# Patient Record
Sex: Female | Born: 1965 | Race: Black or African American | Hispanic: No | Marital: Married | Smoking: Never smoker
Health system: Southern US, Community
[De-identification: ages and names within clinical notes are randomized; demographics above are authoritative.]

## PROBLEM LIST (undated history)

## (undated) DIAGNOSIS — D649 Anemia, unspecified: Secondary | ICD-10-CM

## (undated) DIAGNOSIS — R519 Headache, unspecified: Secondary | ICD-10-CM

## (undated) DIAGNOSIS — R51 Headache: Secondary | ICD-10-CM

## (undated) DIAGNOSIS — I10 Essential (primary) hypertension: Secondary | ICD-10-CM

## (undated) HISTORY — PX: ROTATOR CUFF REPAIR: SHX139

## (undated) HISTORY — PX: HERNIA REPAIR: SHX51

---

## 1999-07-07 ENCOUNTER — Other Ambulatory Visit: Admission: RE | Admit: 1999-07-07 | Discharge: 1999-07-07 | Payer: Self-pay | Admitting: Obstetrics & Gynecology

## 2002-10-11 ENCOUNTER — Other Ambulatory Visit: Admission: RE | Admit: 2002-10-11 | Discharge: 2002-10-11 | Payer: Self-pay | Admitting: Obstetrics & Gynecology

## 2002-10-17 ENCOUNTER — Encounter: Payer: Self-pay | Admitting: Obstetrics & Gynecology

## 2002-10-17 ENCOUNTER — Encounter: Admission: RE | Admit: 2002-10-17 | Discharge: 2002-10-17 | Payer: Self-pay | Admitting: Obstetrics & Gynecology

## 2004-02-13 ENCOUNTER — Other Ambulatory Visit: Admission: RE | Admit: 2004-02-13 | Discharge: 2004-02-13 | Payer: Self-pay | Admitting: Obstetrics & Gynecology

## 2009-07-04 ENCOUNTER — Encounter: Admission: RE | Admit: 2009-07-04 | Discharge: 2009-07-04 | Payer: Self-pay | Admitting: Obstetrics & Gynecology

## 2010-07-04 ENCOUNTER — Encounter: Admission: RE | Admit: 2010-07-04 | Discharge: 2010-07-04 | Payer: Self-pay | Admitting: Obstetrics & Gynecology

## 2011-06-17 ENCOUNTER — Other Ambulatory Visit: Payer: Self-pay | Admitting: Obstetrics & Gynecology

## 2011-06-17 DIAGNOSIS — Z1231 Encounter for screening mammogram for malignant neoplasm of breast: Secondary | ICD-10-CM

## 2011-07-07 ENCOUNTER — Ambulatory Visit
Admission: RE | Admit: 2011-07-07 | Discharge: 2011-07-07 | Disposition: A | Discharge status: Home / Self Care | Payer: Managed Care, Other (non HMO) | Source: Ambulatory Visit | Attending: Obstetrics & Gynecology | Admitting: Obstetrics & Gynecology

## 2011-07-07 DIAGNOSIS — Z1231 Encounter for screening mammogram for malignant neoplasm of breast: Secondary | ICD-10-CM

## 2012-06-09 ENCOUNTER — Other Ambulatory Visit: Payer: Self-pay | Admitting: Obstetrics & Gynecology

## 2012-06-09 DIAGNOSIS — Z1231 Encounter for screening mammogram for malignant neoplasm of breast: Secondary | ICD-10-CM

## 2012-07-07 ENCOUNTER — Ambulatory Visit
Admission: RE | Admit: 2012-07-07 | Discharge: 2012-07-07 | Disposition: A | Discharge status: Home / Self Care | Payer: Managed Care, Other (non HMO) | Source: Ambulatory Visit | Attending: Obstetrics & Gynecology | Admitting: Obstetrics & Gynecology

## 2012-07-07 DIAGNOSIS — Z1231 Encounter for screening mammogram for malignant neoplasm of breast: Secondary | ICD-10-CM

## 2013-07-03 ENCOUNTER — Other Ambulatory Visit: Payer: Self-pay

## 2013-07-03 DIAGNOSIS — Z1231 Encounter for screening mammogram for malignant neoplasm of breast: Secondary | ICD-10-CM

## 2013-07-31 ENCOUNTER — Ambulatory Visit: Payer: Managed Care, Other (non HMO)

## 2013-08-21 ENCOUNTER — Ambulatory Visit
Admission: RE | Admit: 2013-08-21 | Discharge: 2013-08-21 | Disposition: A | Discharge status: Home / Self Care | Payer: Managed Care, Other (non HMO) | Source: Ambulatory Visit

## 2013-08-21 DIAGNOSIS — Z1231 Encounter for screening mammogram for malignant neoplasm of breast: Secondary | ICD-10-CM

## 2017-09-14 HISTORY — PX: REDUCTION MAMMAPLASTY: SUR839

## 2018-05-24 ENCOUNTER — Other Ambulatory Visit: Payer: Self-pay | Admitting: Obstetrics & Gynecology

## 2018-05-24 DIAGNOSIS — Z1231 Encounter for screening mammogram for malignant neoplasm of breast: Secondary | ICD-10-CM

## 2018-06-17 ENCOUNTER — Ambulatory Visit: Payer: Managed Care, Other (non HMO)

## 2018-08-04 ENCOUNTER — Encounter (HOSPITAL_BASED_OUTPATIENT_CLINIC_OR_DEPARTMENT_OTHER): Payer: Self-pay | Admitting: *Deleted

## 2018-08-04 ENCOUNTER — Encounter: Payer: Self-pay | Admitting: Obstetrics & Gynecology

## 2018-08-05 ENCOUNTER — Other Ambulatory Visit: Payer: Self-pay

## 2018-08-05 ENCOUNTER — Encounter (HOSPITAL_BASED_OUTPATIENT_CLINIC_OR_DEPARTMENT_OTHER): Payer: Self-pay | Admitting: *Deleted

## 2018-08-05 ENCOUNTER — Ambulatory Visit: Payer: Self-pay | Admitting: Plastic Surgery

## 2018-08-08 ENCOUNTER — Encounter (HOSPITAL_BASED_OUTPATIENT_CLINIC_OR_DEPARTMENT_OTHER)
Admission: RE | Admit: 2018-08-08 | Discharge: 2018-08-08 | Disposition: A | Discharge status: Home / Self Care | Payer: Managed Care, Other (non HMO) | Source: Ambulatory Visit | Attending: Obstetrics and Gynecology | Admitting: Obstetrics and Gynecology

## 2018-08-08 DIAGNOSIS — Z01818 Encounter for other preprocedural examination: Secondary | ICD-10-CM | POA: Insufficient documentation

## 2018-08-08 LAB — BASIC METABOLIC PANEL
Anion gap: 8 (ref 5–15)
BUN: 11 mg/dL (ref 6–20)
CHLORIDE: 102 mmol/L (ref 98–111)
CO2: 26 mmol/L (ref 22–32)
CREATININE: 0.82 mg/dL (ref 0.44–1.00)
Calcium: 9.3 mg/dL (ref 8.9–10.3)
GFR calc Af Amer: >60 mL/min (ref >60)
GFR calc non Af Amer: >60 mL/min (ref >60)
Glucose, Bld: 83 mg/dL (ref 70–99)
POTASSIUM: 3.5 mmol/L (ref 3.5–5.1)
SODIUM: 136 mmol/L (ref 135–145)

## 2018-08-08 NOTE — Progress Notes (Signed)
Surgical soap given, patient verbalized understanding.

## 2018-08-16 ENCOUNTER — Ambulatory Visit (HOSPITAL_BASED_OUTPATIENT_CLINIC_OR_DEPARTMENT_OTHER): Payer: Managed Care, Other (non HMO) | Admitting: Anesthesiology

## 2018-08-16 ENCOUNTER — Encounter (HOSPITAL_BASED_OUTPATIENT_CLINIC_OR_DEPARTMENT_OTHER): Payer: Self-pay | Admitting: Anesthesiology

## 2018-08-16 ENCOUNTER — Encounter (HOSPITAL_BASED_OUTPATIENT_CLINIC_OR_DEPARTMENT_OTHER)
Admission: RE | Disposition: A | Discharge status: Home / Self Care | Payer: Self-pay | Source: Ambulatory Visit | Attending: Plastic Surgery

## 2018-08-16 ENCOUNTER — Ambulatory Visit (HOSPITAL_BASED_OUTPATIENT_CLINIC_OR_DEPARTMENT_OTHER)
Admission: RE | Admit: 2018-08-16 | Discharge: 2018-08-16 | Disposition: A | Discharge status: Home / Self Care | Payer: Managed Care, Other (non HMO) | Source: Ambulatory Visit | Attending: Plastic Surgery | Admitting: Plastic Surgery

## 2018-08-16 DIAGNOSIS — I10 Essential (primary) hypertension: Secondary | ICD-10-CM | POA: Insufficient documentation

## 2018-08-16 DIAGNOSIS — N62 Hypertrophy of breast: Secondary | ICD-10-CM | POA: Insufficient documentation

## 2018-08-16 HISTORY — DX: Headache: R51

## 2018-08-16 HISTORY — DX: Essential (primary) hypertension: I10

## 2018-08-16 HISTORY — DX: Anemia, unspecified: D64.9

## 2018-08-16 HISTORY — PX: BREAST REDUCTION SURGERY: SHX8

## 2018-08-16 HISTORY — DX: Headache, unspecified: R51.9

## 2018-08-16 SURGERY — MAMMOPLASTY, REDUCTION
Anesthesia: General | Site: Breast | Laterality: Bilateral

## 2018-08-16 MED ORDER — DEXAMETHASONE SODIUM PHOSPHATE 10 MG/ML IJ SOLN
INTRAMUSCULAR | Status: AC
  Filled 2018-08-16: qty 1

## 2018-08-16 MED ORDER — ONDANSETRON HCL 4 MG/2ML IJ SOLN
INTRAMUSCULAR | Status: DC | PRN
  Administered 2018-08-16 (×2): 4 mg via INTRAVENOUS

## 2018-08-16 MED ORDER — BUPIVACAINE LIPOSOME 1.3 % IJ SUSP
INTRAMUSCULAR | Status: AC
  Filled 2018-08-16: qty 20

## 2018-08-16 MED ORDER — SCOPOLAMINE 1 MG/3DAYS TD PT72
MEDICATED_PATCH | TRANSDERMAL | Status: AC
  Filled 2018-08-16: qty 1

## 2018-08-16 MED ORDER — BUPIVACAINE HCL (PF) 0.25 % IJ SOLN: INTRAMUSCULAR | Status: DC | PRN

## 2018-08-16 MED ORDER — PROPOFOL 10 MG/ML IV BOLUS
INTRAVENOUS | Status: DC | PRN
  Administered 2018-08-16: 200 mg via INTRAVENOUS
  Administered 2018-08-16: 20 mg via INTRAVENOUS

## 2018-08-16 MED ORDER — DEXAMETHASONE SODIUM PHOSPHATE 4 MG/ML IJ SOLN
INTRAMUSCULAR | Status: DC | PRN
  Administered 2018-08-16: 10 mg via INTRAVENOUS

## 2018-08-16 MED ORDER — BUPIVACAINE LIPOSOME 1.3 % IJ SUSP
INTRAMUSCULAR | Status: DC | PRN
  Administered 2018-08-16: 09:00:00

## 2018-08-16 MED ORDER — SODIUM CHLORIDE (PF) 0.9 % IJ SOLN
INTRAMUSCULAR | Status: DC | PRN
  Administered 2018-08-16: 60 mL

## 2018-08-16 MED ORDER — PHENYLEPHRINE HCL 10 MG/ML IJ SOLN
INTRAMUSCULAR | Status: AC
  Filled 2018-08-16: qty 1

## 2018-08-16 MED ORDER — SUGAMMADEX SODIUM 200 MG/2ML IV SOLN
INTRAVENOUS | Status: DC | PRN
  Administered 2018-08-16: 200 mg via INTRAVENOUS

## 2018-08-16 MED ORDER — LACTATED RINGERS IV SOLN
INTRAVENOUS | Status: DC
  Administered 2018-08-16 (×4): via INTRAVENOUS

## 2018-08-16 MED ORDER — BACITRACIN ZINC 500 UNIT/GM EX OINT
TOPICAL_OINTMENT | CUTANEOUS | Status: AC
  Filled 2018-08-16: qty 28.35

## 2018-08-16 MED ORDER — FENTANYL CITRATE (PF) 100 MCG/2ML IJ SOLN
INTRAMUSCULAR | Status: AC
  Filled 2018-08-16: qty 2

## 2018-08-16 MED ORDER — PHENYLEPHRINE 40 MCG/ML (10ML) SYRINGE FOR IV PUSH (FOR BLOOD PRESSURE SUPPORT)
PREFILLED_SYRINGE | INTRAVENOUS | Status: DC | PRN
  Administered 2018-08-16: 160 ug via INTRAVENOUS
  Administered 2018-08-16 (×3): 80 ug via INTRAVENOUS

## 2018-08-16 MED ORDER — FENTANYL CITRATE (PF) 100 MCG/2ML IJ SOLN
50.0000 ug | INTRAMUSCULAR | Status: AC | PRN
  Administered 2018-08-16: 100 ug via INTRAVENOUS
  Administered 2018-08-16 (×2): 25 ug via INTRAVENOUS
  Administered 2018-08-16: 50 ug via INTRAVENOUS

## 2018-08-16 MED ORDER — LIDOCAINE-EPINEPHRINE 1 %-1:100000 IJ SOLN
INTRAMUSCULAR | Status: AC
  Filled 2018-08-16: qty 1

## 2018-08-16 MED ORDER — ROCURONIUM BROMIDE 50 MG/5ML IV SOSY
PREFILLED_SYRINGE | INTRAVENOUS | Status: AC
  Filled 2018-08-16: qty 5

## 2018-08-16 MED ORDER — ONDANSETRON HCL 4 MG/2ML IJ SOLN
INTRAMUSCULAR | Status: AC
  Filled 2018-08-16: qty 2

## 2018-08-16 MED ORDER — SODIUM CHLORIDE 0.9 % IV SOLN
INTRAVENOUS | Status: DC | PRN
  Administered 2018-08-16: 100 ug/min via INTRAVENOUS

## 2018-08-16 MED ORDER — FENTANYL CITRATE (PF) 100 MCG/2ML IJ SOLN
25.0000 ug | INTRAMUSCULAR | Status: DC | PRN
  Administered 2018-08-16: 50 ug via INTRAVENOUS

## 2018-08-16 MED ORDER — ROCURONIUM BROMIDE 100 MG/10ML IV SOLN
INTRAVENOUS | Status: DC | PRN
  Administered 2018-08-16: 50 mg via INTRAVENOUS
  Administered 2018-08-16: 10 mg via INTRAVENOUS
  Administered 2018-08-16: 25 mg via INTRAVENOUS
  Administered 2018-08-16 (×2): 10 mg via INTRAVENOUS

## 2018-08-16 MED ORDER — SODIUM CHLORIDE (PF) 0.9 % IJ SOLN
INTRAMUSCULAR | Status: DC | PRN
  Administered 2018-08-16: 90 mL via INTRAVENOUS

## 2018-08-16 MED ORDER — CHLORHEXIDINE GLUCONATE CLOTH 2 % EX PADS: 6.0000 | MEDICATED_PAD | Freq: Once | CUTANEOUS | Status: DC

## 2018-08-16 MED ORDER — CEFAZOLIN SODIUM-DEXTROSE 2-4 GM/100ML-% IV SOLN
INTRAVENOUS | Status: AC
  Filled 2018-08-16: qty 100

## 2018-08-16 MED ORDER — EPINEPHRINE 30 MG/30ML IJ SOLN
INTRAMUSCULAR | Status: AC
  Filled 2018-08-16: qty 1

## 2018-08-16 MED ORDER — SUGAMMADEX SODIUM 200 MG/2ML IV SOLN
INTRAVENOUS | Status: AC
  Filled 2018-08-16: qty 2

## 2018-08-16 MED ORDER — CEFAZOLIN SODIUM-DEXTROSE 2-4 GM/100ML-% IV SOLN
2.0000 g | INTRAVENOUS | Status: AC
  Administered 2018-08-16 (×2): 2 g via INTRAVENOUS

## 2018-08-16 MED ORDER — PROPOFOL 10 MG/ML IV BOLUS
INTRAVENOUS | Status: AC
  Filled 2018-08-16: qty 20

## 2018-08-16 MED ORDER — MIDAZOLAM HCL 2 MG/2ML IJ SOLN
INTRAMUSCULAR | Status: AC
  Filled 2018-08-16: qty 2

## 2018-08-16 MED ORDER — OXYCODONE-ACETAMINOPHEN 5-325 MG PO TABS
ORAL_TABLET | ORAL | Status: AC
  Filled 2018-08-16: qty 1

## 2018-08-16 MED ORDER — METOCLOPRAMIDE HCL 5 MG/ML IJ SOLN: 10.0000 mg | Freq: Once | INTRAMUSCULAR | Status: DC | PRN

## 2018-08-16 MED ORDER — EPINEPHRINE PF 1 MG/ML IJ SOLN
INTRAMUSCULAR | Status: DC | PRN
  Administered 2018-08-16: .15 mL

## 2018-08-16 MED ORDER — BACITRACIN ZINC 500 UNIT/GM EX OINT
TOPICAL_OINTMENT | CUTANEOUS | Status: DC | PRN
  Administered 2018-08-16: 1 via TOPICAL

## 2018-08-16 MED ORDER — BUPIVACAINE HCL (PF) 0.5 % IJ SOLN
INTRAMUSCULAR | Status: AC
  Filled 2018-08-16: qty 30

## 2018-08-16 MED ORDER — CEFAZOLIN SODIUM 1 G IJ SOLR
INTRAMUSCULAR | Status: AC
  Filled 2018-08-16: qty 20

## 2018-08-16 MED ORDER — SODIUM CHLORIDE (PF) 0.9 % IJ SOLN
INTRAMUSCULAR | Status: AC
  Filled 2018-08-16: qty 20

## 2018-08-16 MED ORDER — BUPIVACAINE HCL (PF) 0.25 % IJ SOLN
INTRAMUSCULAR | Status: AC
  Filled 2018-08-16: qty 30

## 2018-08-16 MED ORDER — LIDOCAINE 2% (20 MG/ML) 5 ML SYRINGE
INTRAMUSCULAR | Status: AC
  Filled 2018-08-16: qty 5

## 2018-08-16 MED ORDER — LIDOCAINE HCL (CARDIAC) PF 100 MG/5ML IV SOSY
PREFILLED_SYRINGE | INTRAVENOUS | Status: DC | PRN
  Administered 2018-08-16: 100 mg via INTRAVENOUS

## 2018-08-16 MED ORDER — LACTATED RINGERS IV SOLN: INTRAVENOUS | Status: DC

## 2018-08-16 MED ORDER — OXYCODONE-ACETAMINOPHEN 5-325 MG PO TABS
1.0000 | ORAL_TABLET | Freq: Once | ORAL | Status: AC | PRN
  Administered 2018-08-16: 1 via ORAL

## 2018-08-16 MED ORDER — MIDAZOLAM HCL 2 MG/2ML IJ SOLN
1.0000 mg | INTRAMUSCULAR | Status: DC | PRN
  Administered 2018-08-16: 2 mg via INTRAVENOUS

## 2018-08-16 MED ORDER — BUPIVACAINE-EPINEPHRINE (PF) 0.25% -1:200000 IJ SOLN
INTRAMUSCULAR | Status: AC
  Filled 2018-08-16: qty 60

## 2018-08-16 MED ORDER — SCOPOLAMINE 1 MG/3DAYS TD PT72
1.0000 | MEDICATED_PATCH | Freq: Once | TRANSDERMAL | Status: AC | PRN
  Administered 2018-08-16: 1 via TRANSDERMAL

## 2018-08-16 MED ORDER — BUPIVACAINE HCL (PF) 0.5 % IJ SOLN
INTRAMUSCULAR | Status: DC | PRN
  Administered 2018-08-16: 30 mL

## 2018-08-16 SURGICAL SUPPLY — 65 items
BAG DECANTER FOR FLEXI CONT (MISCELLANEOUS) ×2 IMPLANT
BENZOIN TINCTURE PRP APPL 2/3 (GAUZE/BANDAGES/DRESSINGS) ×4 IMPLANT
BLADE KNIFE PERSONA 10 (BLADE) ×8 IMPLANT
BLADE KNIFE PERSONA 15 (BLADE) ×6 IMPLANT
BNDG GAUZE ELAST 4 BULKY (GAUZE/BANDAGES/DRESSINGS) ×4 IMPLANT
CANISTER SUCT 1200ML W/VALVE (MISCELLANEOUS) ×2 IMPLANT
CAP BOUFFANT 24 BLUE NURSES (PROTECTIVE WEAR) ×2 IMPLANT
COVER BACK TABLE 60X90IN (DRAPES) ×2 IMPLANT
COVER MAYO STAND STRL (DRAPES) ×2 IMPLANT
COVER WAND RF STERILE (DRAPES) IMPLANT
DECANTER SPIKE VIAL GLASS SM (MISCELLANEOUS) ×2 IMPLANT
DRAIN CHANNEL 10F 3/8 F FF (DRAIN) ×4 IMPLANT
DRAPE LAPAROSCOPIC ABDOMINAL (DRAPES) IMPLANT
DRAPE U-SHAPE 76X120 STRL (DRAPES) ×2 IMPLANT
DRSG EMULSION OIL 3X3 NADH (GAUZE/BANDAGES/DRESSINGS) ×4 IMPLANT
DRSG PAD ABDOMINAL 8X10 ST (GAUZE/BANDAGES/DRESSINGS) ×4 IMPLANT
ELECT REM PT RETURN 9FT ADLT (ELECTROSURGICAL) ×2
ELECTRODE REM PT RTRN 9FT ADLT (ELECTROSURGICAL) ×1 IMPLANT
EVACUATOR SILICONE 100CC (DRAIN) ×4 IMPLANT
FILTER 7/8 IN (FILTER) IMPLANT
GAUZE SPONGE 4X4 12PLY STRL (GAUZE/BANDAGES/DRESSINGS) ×4 IMPLANT
GLOVE BIO SURGEON STRL SZ 6.5 (GLOVE) ×1 IMPLANT
GLOVE BIO SURGEON STRL SZ7 (GLOVE) ×5 IMPLANT
GLOVE BIOGEL PI IND STRL 7.0 (GLOVE) IMPLANT
GLOVE BIOGEL PI INDICATOR 7.0 (GLOVE) ×2
GOWN STRL REUS W/ TWL LRG LVL3 (GOWN DISPOSABLE) ×2 IMPLANT
GOWN STRL REUS W/TWL LRG LVL3 (GOWN DISPOSABLE) ×2
IV NS 250ML (IV SOLUTION)
IV NS 250ML BAXH (IV SOLUTION) ×1 IMPLANT
NDL HYPO 25X1 1.5 SAFETY (NEEDLE) ×3 IMPLANT
NDL SAFETY ECLIPSE 18X1.5 (NEEDLE) ×1 IMPLANT
NDL SPNL 18GX3.5 QUINCKE PK (NEEDLE) ×1 IMPLANT
NEEDLE HYPO 18GX1.5 SHARP (NEEDLE) ×1
NEEDLE HYPO 25X1 1.5 SAFETY (NEEDLE) ×6 IMPLANT
NEEDLE SPNL 18GX3.5 QUINCKE PK (NEEDLE) ×2 IMPLANT
NS IRRIG 1000ML POUR BTL (IV SOLUTION) ×4 IMPLANT
PACK BASIN DAY SURGERY FS (CUSTOM PROCEDURE TRAY) ×2 IMPLANT
PIN SAFETY STERILE (MISCELLANEOUS) ×2 IMPLANT
SCRUB TECHNI CARE 4 OZ NO DYE (MISCELLANEOUS) ×1 IMPLANT
SLEEVE SCD COMPRESS KNEE MED (MISCELLANEOUS) ×2 IMPLANT
SPECIMEN JAR MEDIUM (MISCELLANEOUS) ×2 IMPLANT
SPECIMEN JAR X LARGE (MISCELLANEOUS) ×2 IMPLANT
SPONGE LAP 18X18 RF (DISPOSABLE) ×7 IMPLANT
STAPLER VISISTAT 35W (STAPLE) ×2 IMPLANT
STRIP CLOSURE SKIN 1/2X4 (GAUZE/BANDAGES/DRESSINGS) ×8 IMPLANT
SUT ETHILON 3 0 PS 1 (SUTURE) ×2 IMPLANT
SUT MNCRL AB 3-0 PS2 18 (SUTURE) ×8 IMPLANT
SUT MNCRL AB 4-0 PS2 18 (SUTURE) ×4 IMPLANT
SUT MON AB 5-0 PS2 18 (SUTURE) ×4 IMPLANT
SUT PROLENE 2 0 CT2 30 (SUTURE) ×2 IMPLANT
SUT PROLENE 3 0 PS 1 (SUTURE) ×4 IMPLANT
SUT VLOC 90 P-14 23 (SUTURE) ×4 IMPLANT
SYR BULB IRRIGATION 50ML (SYRINGE) ×4 IMPLANT
SYR CONTROL 10ML LL (SYRINGE) ×4 IMPLANT
TAPE MEASURE VINYL STERILE (MISCELLANEOUS) ×2 IMPLANT
TOWEL GREEN STERILE FF (TOWEL DISPOSABLE) ×6 IMPLANT
TOWEL OR NON WOVEN STRL DISP B (DISPOSABLE) IMPLANT
TRAY DSU PREP LF (CUSTOM PROCEDURE TRAY) ×2 IMPLANT
TRAY FOL W/BAG SLVR 16FR STRL (SET/KITS/TRAYS/PACK) ×1 IMPLANT
TRAY FOLEY W/BAG SLVR 14FR LF (SET/KITS/TRAYS/PACK) ×1 IMPLANT
TRAY FOLEY W/BAG SLVR 16FR LF (SET/KITS/TRAYS/PACK)
TUBE CONNECTING 20X1/4 (TUBING) ×2 IMPLANT
UNDERPAD 30X30 (UNDERPADS AND DIAPERS) ×4 IMPLANT
VAC PENCILS W/TUBING CLEAR (MISCELLANEOUS) ×3 IMPLANT
YANKAUER SUCT BULB TIP NO VENT (SUCTIONS) ×2 IMPLANT

## 2018-08-16 NOTE — Anesthesia Procedure Notes (Signed)
Procedure Name: Intubation Date/Time: 08/16/2018 7:50 AM Performed by: Lyndee Leo, CRNA Pre-anesthesia Checklist: Patient identified, Emergency Drugs available, Suction available and Patient being monitored Patient Re-evaluated:Patient Re-evaluated prior to induction Oxygen Delivery Method: Circle system utilized Preoxygenation: Pre-oxygenation with 100% oxygen Induction Type: IV induction Ventilation: Mask ventilation without difficulty Laryngoscope Size: Mac and 3 Grade View: Grade I Tube type: Oral Number of attempts: 1 Airway Equipment and Method: Stylet and Oral airway Placement Confirmation: ETT inserted through vocal cords under direct vision,  positive ETCO2 and breath sounds checked- equal and bilateral Secured at: 22 cm Tube secured with: Tape Dental Injury: Teeth and Oropharynx as per pre-operative assessment

## 2018-08-16 NOTE — H&P (Signed)
  H&P faxed to surgical center.  -History and Physical Reviewed  -Patient has been re-examined  -No change in the plan of care  Ermina Oberman A    

## 2018-08-16 NOTE — Discharge Instructions (Signed)
1. No lifting greater than 5 lbs with arms for 4 weeks. 2. Empty, strip, record and reactivate JP drains 3 times a day. 3. Percocet 5/325 mg tabs 1-2 tabs po q 4-6 hours prn pain- prescription given in office. 4. Duricef 1 tab po bid- prescription given in office. 5. Sterapred dose pack as directed- prescription given in office. 6. Follow-up appointment Friday in office.    Post Anesthesia Home Care Instructions  Activity: Get plenty of rest for the remainder of the day. A responsible individual must stay with you for 24 hours following the procedure.  For the next 24 hours, DO NOT: -Drive a car -Operate machinery -Drink alcoholic beverages -Take any medication unless instructed by your physician -Make any legal decisions or sign important papers.  Meals: Start with liquid foods such as gelatin or soup. Progress to regular foods as tolerated. Avoid greasy, spicy, heavy foods. If nausea and/or vomiting occur, drink only clear liquids until the nausea and/or vomiting subsides. Call your physician if vomiting continues.  Special Instructions/Symptoms: Your throat may feel dry or sore from the anesthesia or the breathing tube placed in your throat during surgery. If this causes discomfort, gargle with warm salt water. The discomfort should disappear within 24 hours.  If you had a scopolamine patch placed behind your ear for the management of post- operative nausea and/or vomiting:  1. The medication in the patch is effective for 72 hours, after which it should be removed.  Wrap patch in a tissue and discard in the trash. Wash hands thoroughly with soap and water. 2. You may remove the patch earlier than 72 hours if you experience unpleasant side effects which may include dry mouth, dizziness or visual disturbances. 3. Avoid touching the patch. Wash your hands with soap and water after contact with the patch.    Information for Discharge Teaching: EXPAREL (bupivacaine liposome injectable  suspension)   Your surgeon or anesthesiologist gave you EXPAREL(bupivacaine) to help control your pain after surgery.   EXPAREL is a local anesthetic that provides pain relief by numbing the tissue around the surgical site.  EXPAREL is designed to release pain medication over time and can control pain for up to 72 hours.  Depending on how you respond to EXPAREL, you may require less pain medication during your recovery.  Possible side effects:  Temporary loss of sensation or ability to move in the area where bupivacaine was injected.  Nausea, vomiting, constipation  Rarely, numbness and tingling in your mouth or lips, lightheadedness, or anxiety may occur.  Call your doctor right away if you think you may be experiencing any of these sensations, or if you have other questions regarding possible side effects.  Follow all other discharge instructions given to you by your surgeon or nurse. Eat a healthy diet and drink plenty of water or other fluids.  If you return to the hospital for any reason within 96 hours following the administration of EXPAREL, it is important for health care providers to know that you have received this anesthetic. A teal colored band has been placed on your arm with the date, time and amount of EXPAREL you have received in order to alert and inform your health care providers. Please leave this armband in place for the full 96 hours following administration, and then you may remove the band.    JP Drain Totals  Bring this sheet to all of your post-operative appointments while you have your drains.  Please measure your drains by CC's   or ML's.  Make sure you drain and measure your JP Drains 2 or 3 times per day.  At the end of each day, add up totals for the left side and add up totals for the right side.    ( 9 am )     ( 3 pm )        ( 9 pm )                Date L  R  L  R  L  R  Total L/R                                                                                                                                                                                             

## 2018-08-16 NOTE — Anesthesia Postprocedure Evaluation (Signed)
Anesthesia Post Note  Patient: Shari Li  Procedure(s) Performed: MAMMARY REDUCTION  (BREAST) (Bilateral Breast)     Patient location during evaluation: PACU Anesthesia Type: General Level of consciousness: awake and alert, awake and oriented Pain management: pain level controlled Vital Signs Assessment: post-procedure vital signs reviewed and stable Respiratory status: spontaneous breathing, nonlabored ventilation and respiratory function stable Cardiovascular status: blood pressure returned to baseline and stable Postop Assessment: no apparent nausea or vomiting Anesthetic complications: no    Last Vitals:  Vitals:   08/16/18 1345 08/16/18 1400  BP: 113/69 105/72  Pulse: 89   Resp: (!) 27   Temp:    SpO2: 100%     Last Pain:  Vitals:   08/16/18 1320  TempSrc:   PainSc: Asleep                 Cecile HearingStephen Edward Turk

## 2018-08-16 NOTE — Anesthesia Preprocedure Evaluation (Addendum)
Anesthesia Evaluation  Patient identified by MRN, date of birth, ID band Patient awake    Reviewed: Allergy & Precautions, NPO status , Patient's Chart, lab work & pertinent test results  Airway Mallampati: II  TM Distance: >3 FB Neck ROM: Full    Dental no notable dental hx.    Pulmonary neg pulmonary ROS,    Pulmonary exam normal breath sounds clear to auscultation       Cardiovascular hypertension, Pt. on medications Normal cardiovascular exam Rhythm:Regular Rate:Normal     Neuro/Psych negative neurological ROS  negative psych ROS   GI/Hepatic negative GI ROS, Neg liver ROS,   Endo/Other  negative endocrine ROS  Renal/GU negative Renal ROS  negative genitourinary   Musculoskeletal negative musculoskeletal ROS (+)   Abdominal   Peds negative pediatric ROS (+)  Hematology negative hematology ROS (+)   Anesthesia Other Findings   Reproductive/Obstetrics negative OB ROS                             Anesthesia Physical Anesthesia Plan  ASA: II  Anesthesia Plan: General   Post-op Pain Management:    Induction: Intravenous  PONV Risk Score and Plan: 3 and Ondansetron, Dexamethasone, Midazolam and Treatment may vary due to age or medical condition  Airway Management Planned: Oral ETT  Additional Equipment:   Intra-op Plan:   Post-operative Plan: Extubation in OR  Informed Consent: I have reviewed the patients History and Physical, chart, labs and discussed the procedure including the risks, benefits and alternatives for the proposed anesthesia with the patient or authorized representative who has indicated his/her understanding and acceptance.   Dental advisory given  Plan Discussed with: CRNA  Anesthesia Plan Comments:         Anesthesia Quick Evaluation  

## 2018-08-16 NOTE — Op Note (Signed)
OPERATIVE REPORT  08/16/2018  Shari Li  PREOPERATIVE DIAGNOSIS:  Bilateral Macromastia.  POSTOPERATIVE DIAGNOSIS:  Bilateral Macromastia.  PROCEDURE:  Bilateral Reduction Mammoplasties.  ATTENDING SURGEON:  Eloise LevelsMary Ann Kyonna Frier, MD  ANESTHESIA:  General.  ANESTHESIOLOGISRondall Allegra: S. Turk , MD  COMPLICATIONS:  None.  INDICATIONS FOR THE PROCEDURE:  The patient is a 52 y.o. female who has bilateral macromastia that is clinically symptomatic.  She presents to undergo bilateral reduction mammoplasties.  DESCRIPTION OF PROCEDURE:  The patient was marked in preop holding area in a pattern of Wise for the future bilateral reduction mammoplasties. She was then taken back to the OR, placed on the table in supine position.  After adequate general anesthesia was obtained, the patient's chest was prepped with Techni-Care and draped in sterile fashion.  The bases of the breasts have been infiltrated with 1% lidocaine with epinephrine.  After adequate hemostasis and anesthesia taken effect, the procedure was begun.  Both of the breast reductions were performed in the following similar manner.  The nipple-areolar complex was marked with a 45-mm nipple marker.  The skin was then incised and deepithelialized around the nipple-areolar complex down to the inframammary crease in the inferior pedicle pattern.  Next, the medial, superior, and lateral skin flaps were elevated down to the chest wall.  Excess fat and glandular tissue removed from the inferior pedicle.  The nipple-areolar complex was examined and found to be pink and viable.  The wound was irrigated with saline irrigation.  Meticulous hemostasis was obtained with the Bovie electrocautery.  Inferior pedicle was centralized using 3-0 Prolene suture.  A #10 JP flat fully fluted drain was placed into the wound. The skin flaps were brought together at the inverted T junction with a 2- 0 Prolene suture.  The incisions were stapled  for temporary closure. The breasts compared and found to have good shape and symmetry.  The incisions were then closed from the medial aspect of the JP drain to the medial aspect of the [facility] incision by first placing a few 3-0 Monocryl sutures to tack together the dermal layer, and then both the dermal and cuticular layer were closed in a single layer using a 3-0 V-lock barbed suture.  Lateral to the JP drain incision was closed using 3-0 Monocryl in the dermal layer, followed by 3-0 Monocryl running intracuticular stitch on the skin.  The vertical limb of the Wise pattern was closed in the dermal layer using 3-0 Monocryl suture.  The patient was placed in the upright position.  The future location of the nipple-areolar complexes was marked on both breast mounds using the 45-mm nipple marker.  She was then placed back in the recumbent position.  Both of the nipple areolar complexes were brought out onto the breast mounds in the following similar manner.  The skin was incised as marked and removed in full thickness into the subcutaneous tissues.  The nipple- areolar complex was examined, found to be pink and viable, then brought out through this aperture and sewn in place using 4-0 Monocryl in the dermal layer, followed by 5-0 Monocryl running intracuticular stitch on the skin.  This 5-0 Monocryl suture was then brought down to close the cuticular layer of the vertical limb as well.  The JP drain was sewn in place using 3-0 nylon suture.  The pectoralis major muscle and fascia along with the breast and chest soft tissues were then infiltrated with 1% Exparel (total 266 mg).  Now [facility] incision was also infiltrated  with the Exparel in order to give the patient postoperative pain control.  The incisions were dressed with benzoin, Steri-Strips, and the nipples dressed with bacitracin ointment and Adaptic.  4x4s were placed over the incisions and ABD pads in the axillary areas.  The patient  was placed into a light postoperative support bra.  There were no complications. The patient tolerated the procedure well.  The final needle, sponge counts were reported to be correct at the end of the case.  The patient was then recovered without complications.  Both the patient and her family were given proper postoperative wound care instructions. She was then discharged home in the care of her family in stable condition.  Follow up will be with me in a few days in the office.         Eloise Levels, M.D.  08/16/2018 1:00 PM

## 2018-08-16 NOTE — Transfer of Care (Signed)
Immediate Anesthesia Transfer of Care Note  Patient: Shari Li  Procedure(s) Performed: MAMMARY REDUCTION  (BREAST) (Bilateral Breast)  Patient Location: PACU  Anesthesia Type:General  Level of Consciousness: awake, sedated and patient cooperative  Airway & Oxygen Therapy: Patient Spontanous Breathing and Patient connected to face mask oxygen  Post-op Assessment: Report given to RN and Post -op Vital signs reviewed and stable  Post vital signs: Reviewed and stable  Last Vitals:  Vitals Value Taken Time  BP    Temp    Pulse 86 08/16/2018  1:18 PM  Resp 21 08/16/2018  1:18 PM  SpO2 100 % 08/16/2018  1:18 PM  Vitals shown include unvalidated device data.  Last Pain:  Vitals:   08/16/18 0636  TempSrc: Oral  PainSc: 0-No pain         Complications: No apparent anesthesia complications

## 2018-08-16 NOTE — Brief Op Note (Signed)
08/16/2018  1:01 PM  PATIENT:  Shari Li  52 y.o. female  PRE-OPERATIVE DIAGNOSIS:  BILATER MACROMASTIA  POST-OPERATIVE DIAGNOSIS:  BILATER MACROMASTIA  PROCEDURE:  Procedure(s): MAMMARY REDUCTION  (BREAST) (Bilateral)  SURGEON:  Surgeon(s) and Role:    * Contogiannis, Chales AbrahamsMary Ann, MD - Primary  ANESTHESIA:   general  EBL:  300 mL   BLOOD ADMINISTERED:none  DRAINS: (51F) Jackson-Pratt drain(s) with closed bulb suction in the Bilateral Breasts   LOCAL MEDICATIONS USED:  1.3% Exparel (266 mgs. total)  SPECIMEN:  Source of Specimen:  Bilateral Breasts  DISPOSITION OF SPECIMEN:  PATHOLOGY  COUNTS:  YES  DICTATION: .Note written in EPIC  PLAN OF CARE: Discharge to home after PACU  PATIENT DISPOSITION:  PACU - hemodynamically stable.   Delay start of Pharmacological VTE agent (>24hrs) due to surgical blood loss or risk of bleeding: not applicable

## 2018-08-25 ENCOUNTER — Encounter (HOSPITAL_BASED_OUTPATIENT_CLINIC_OR_DEPARTMENT_OTHER): Payer: Self-pay | Admitting: Plastic Surgery

## 2021-07-14 ENCOUNTER — Other Ambulatory Visit: Payer: Self-pay | Admitting: Obstetrics

## 2021-07-14 DIAGNOSIS — Z1231 Encounter for screening mammogram for malignant neoplasm of breast: Secondary | ICD-10-CM

## 2021-08-15 ENCOUNTER — Ambulatory Visit: Payer: Managed Care, Other (non HMO)

## 2021-09-17 ENCOUNTER — Ambulatory Visit
Admission: RE | Admit: 2021-09-17 | Discharge: 2021-09-17 | Disposition: A | Discharge status: Home / Self Care | Payer: 59 | Source: Ambulatory Visit | Attending: Obstetrics | Admitting: Obstetrics

## 2021-09-17 ENCOUNTER — Other Ambulatory Visit: Payer: Self-pay

## 2021-09-17 DIAGNOSIS — Z1231 Encounter for screening mammogram for malignant neoplasm of breast: Secondary | ICD-10-CM

## 2022-09-17 ENCOUNTER — Other Ambulatory Visit: Payer: Self-pay | Admitting: Obstetrics

## 2022-09-17 DIAGNOSIS — Z1231 Encounter for screening mammogram for malignant neoplasm of breast: Secondary | ICD-10-CM

## 2022-11-06 ENCOUNTER — Ambulatory Visit
Admission: RE | Admit: 2022-11-06 | Discharge: 2022-11-06 | Disposition: A | Discharge status: Home / Self Care | Payer: 59 | Source: Ambulatory Visit | Attending: Obstetrics | Admitting: Obstetrics

## 2022-11-06 DIAGNOSIS — Z1231 Encounter for screening mammogram for malignant neoplasm of breast: Secondary | ICD-10-CM

## 2023-11-24 IMAGING — MG MM DIGITAL SCREENING BILAT W/ TOMO AND CAD
6 of 10 series · 6 of 30 positions shown · non-contrast
Comparison: Previous exam(s).

CLINICAL DATA: Screening.

EXAM:
DIGITAL SCREENING BILATERAL MAMMOGRAM WITH TOMOSYNTHESIS AND CAD
TECHNIQUE: Bilateral screening digital craniocaudal and mediolateral oblique
mammograms were obtained. Bilateral screening digital breast
tomosynthesis was performed. The images were evaluated with
computer-aided detection.

[L MLO synth-2D]
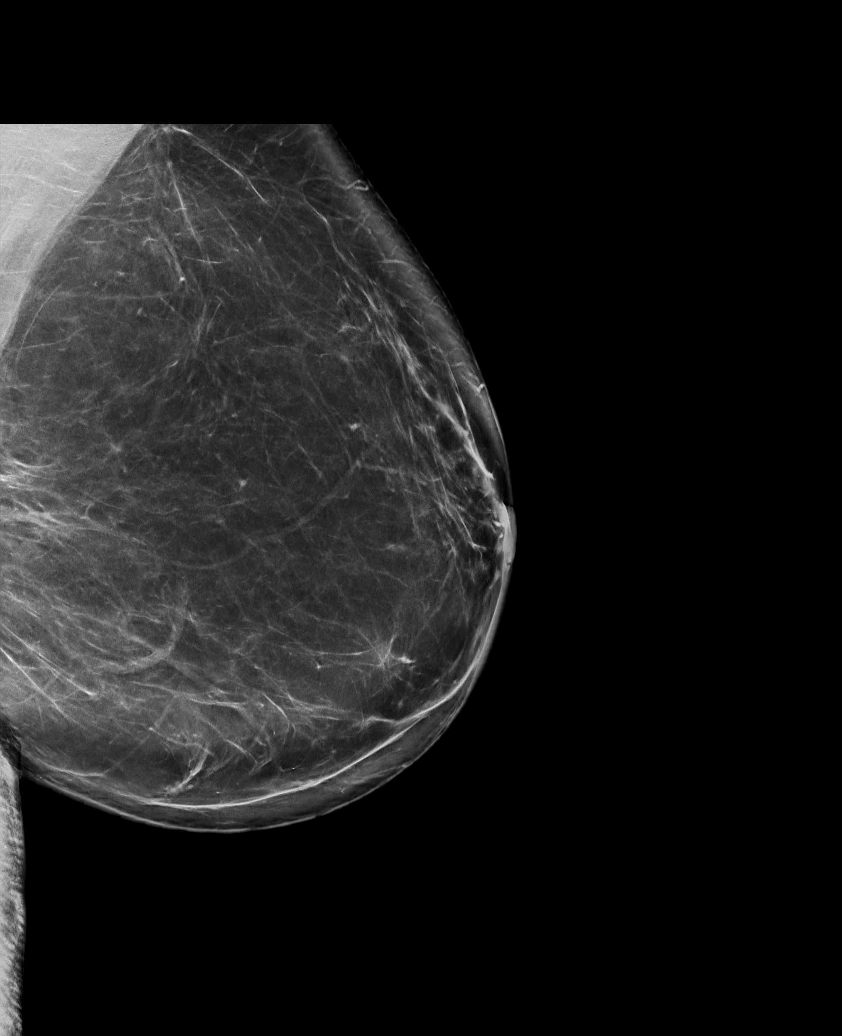

[L CC synth-2D (1 of 2)]
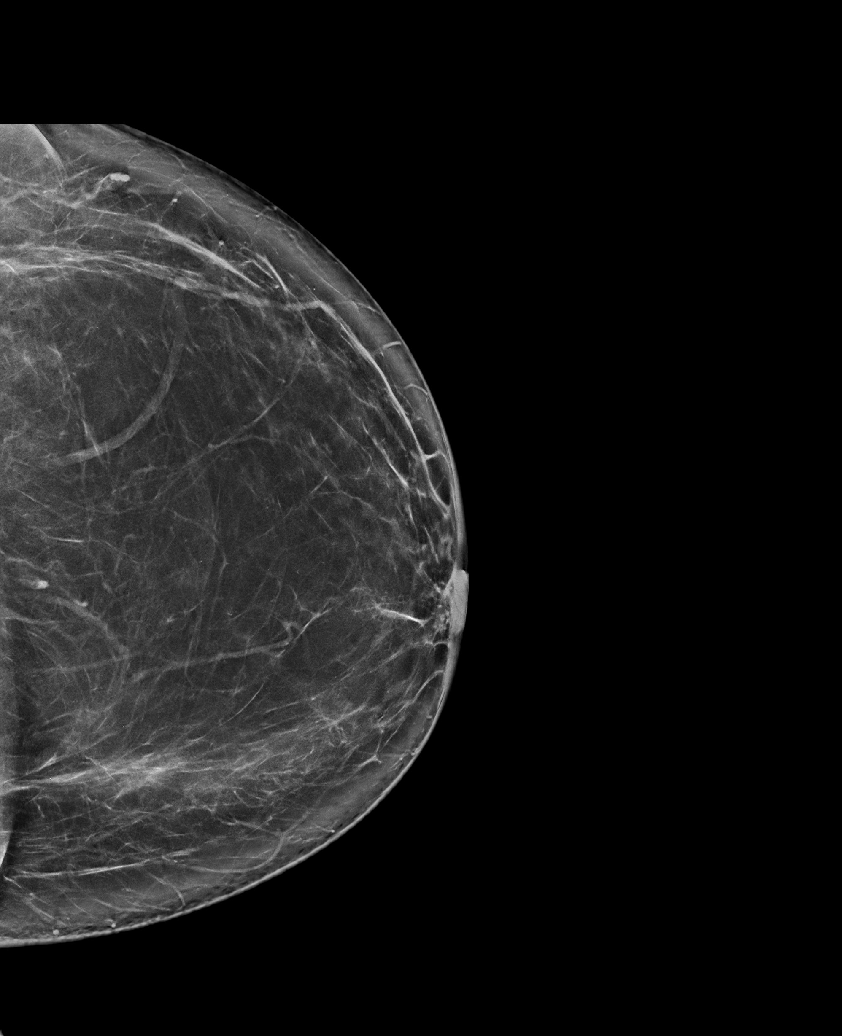

[L CC synth-2D (2 of 2)]
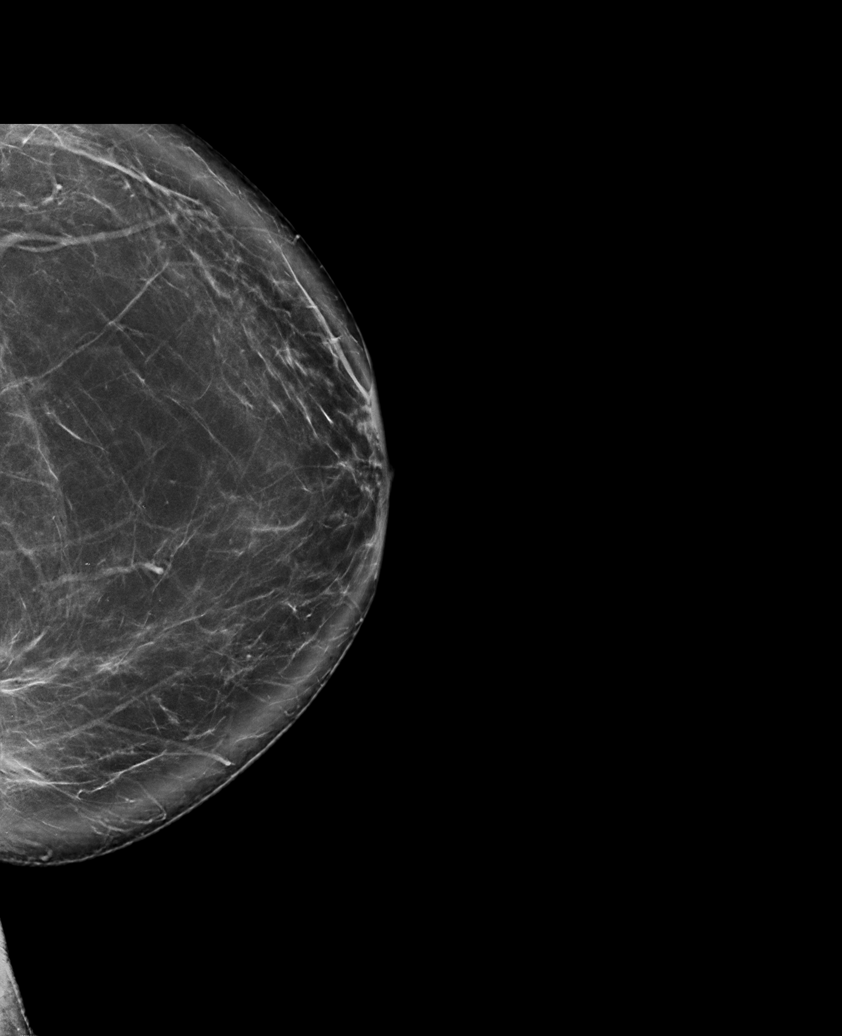

[R MLO synth-2D]
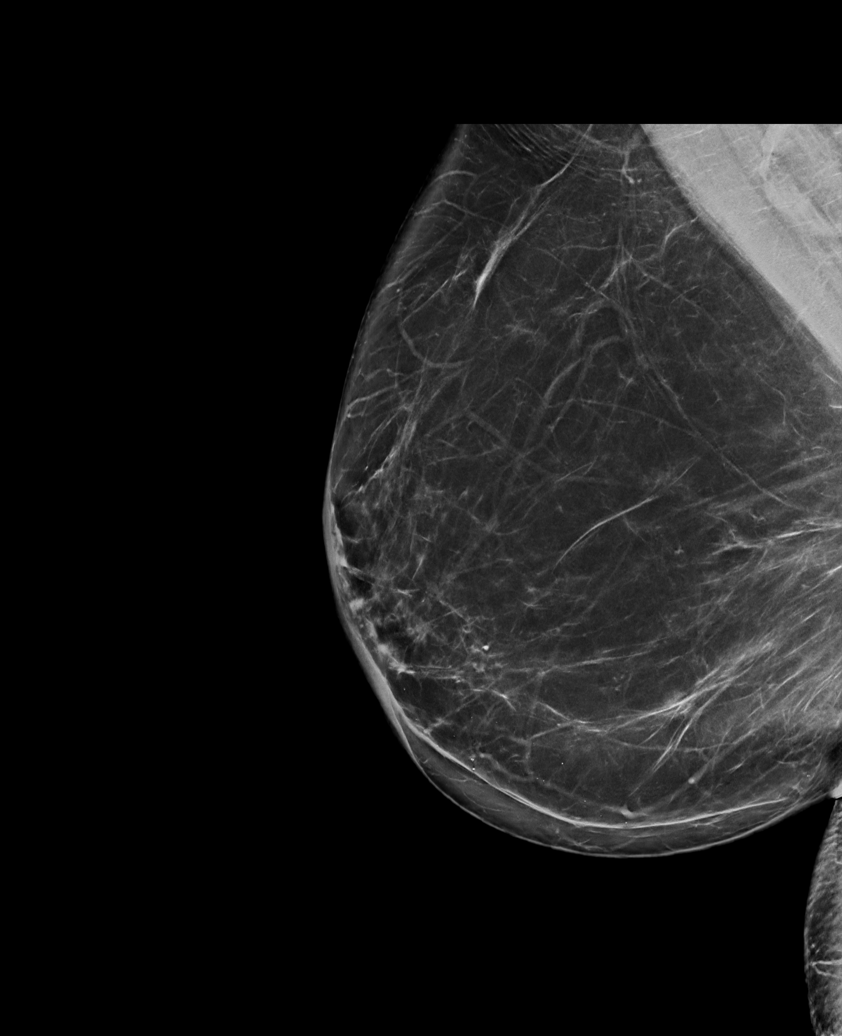

[R CC synth-2D]
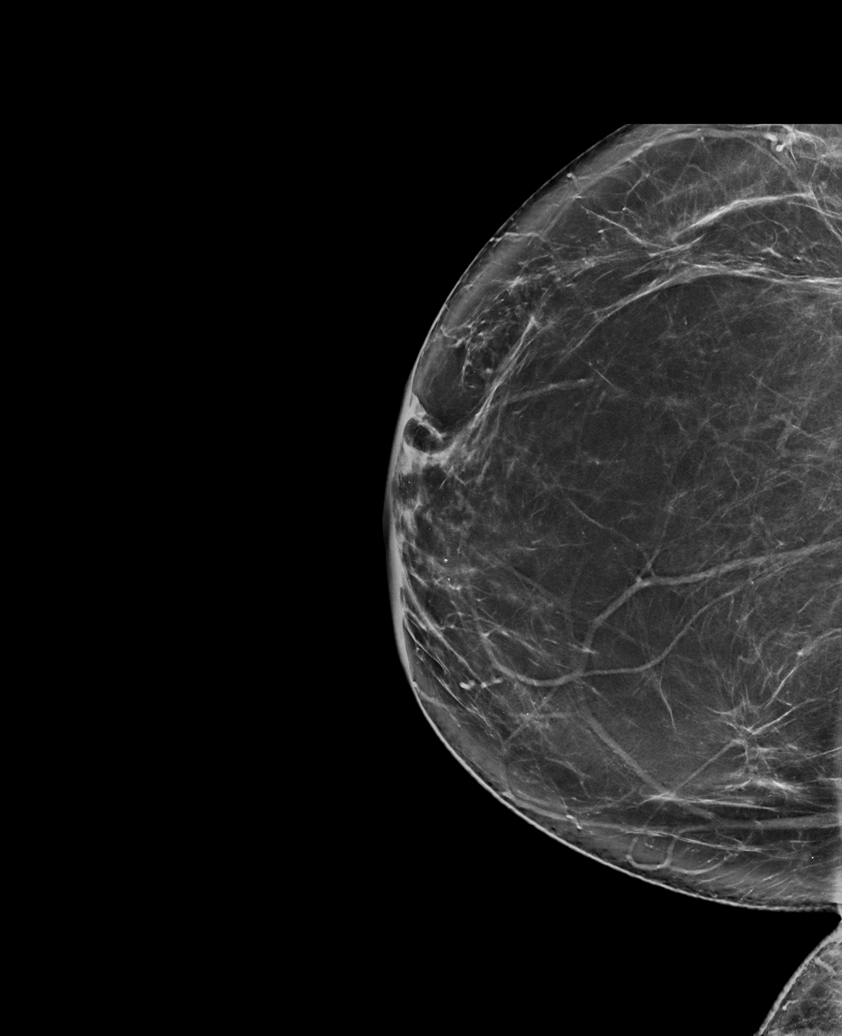

[R CC tomo · tomo slice 45/89.0]
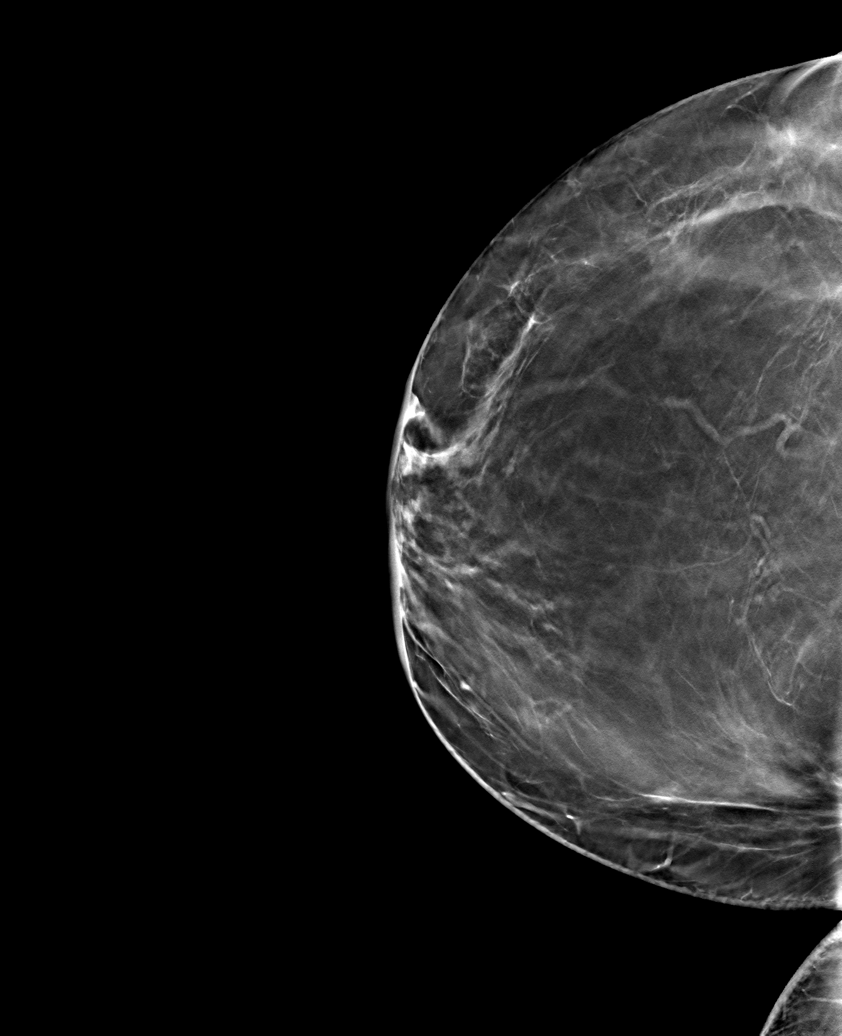

[6 of 30 positions shown; findings below may reference images not displayed]

ACR Breast Density Category b: There are scattered areas of
fibroglandular density.
FINDINGS: There are no findings suspicious for malignancy.
IMPRESSION: No mammographic evidence of malignancy. A result letter of this
screening mammogram will be mailed directly to the patient.

RECOMMENDATION:
Screening mammogram in one year. (Code:51-O-LD2)

BI-RADS CATEGORY  1: Negative.

## 2023-12-07 ENCOUNTER — Other Ambulatory Visit: Payer: Self-pay | Admitting: Obstetrics

## 2023-12-07 DIAGNOSIS — Z1231 Encounter for screening mammogram for malignant neoplasm of breast: Secondary | ICD-10-CM

## 2023-12-17 ENCOUNTER — Ambulatory Visit
Admission: RE | Admit: 2023-12-17 | Discharge: 2023-12-17 | Disposition: A | Discharge status: Home / Self Care | Source: Ambulatory Visit | Attending: Obstetrics | Admitting: Obstetrics

## 2023-12-17 DIAGNOSIS — Z1231 Encounter for screening mammogram for malignant neoplasm of breast: Secondary | ICD-10-CM
